# Patient Record
Sex: Male | Born: 1985 | Race: White | Hispanic: No | Marital: Married | Smoking: Never smoker
Health system: Southern US, Community
[De-identification: ages and names within clinical notes are randomized; demographics above are authoritative.]

---

## 2011-08-30 ENCOUNTER — Encounter: Payer: Self-pay | Admitting: Emergency Medicine

## 2011-08-30 ENCOUNTER — Emergency Department (HOSPITAL_COMMUNITY)
Admission: EM | Admit: 2011-08-30 | Discharge: 2011-08-30 | Disposition: A | Discharge status: Home / Self Care | Payer: 59 | Attending: Emergency Medicine | Admitting: Emergency Medicine

## 2011-08-30 DIAGNOSIS — R51 Headache: Secondary | ICD-10-CM | POA: Insufficient documentation

## 2011-08-30 DIAGNOSIS — R55 Syncope and collapse: Secondary | ICD-10-CM | POA: Insufficient documentation

## 2011-08-30 DIAGNOSIS — IMO0001 Reserved for inherently not codable concepts without codable children: Secondary | ICD-10-CM | POA: Insufficient documentation

## 2011-08-30 DIAGNOSIS — R1110 Vomiting, unspecified: Secondary | ICD-10-CM

## 2011-08-30 DIAGNOSIS — R112 Nausea with vomiting, unspecified: Secondary | ICD-10-CM | POA: Insufficient documentation

## 2011-08-30 DIAGNOSIS — R000 Tachycardia, unspecified: Secondary | ICD-10-CM | POA: Insufficient documentation

## 2011-08-30 LAB — BASIC METABOLIC PANEL
BUN: 16 mg/dL (ref 6–23)
CO2: 26 mEq/L (ref 19–32)
Chloride: 102 mEq/L (ref 96–112)
Glucose, Bld: 118 mg/dL — ABNORMAL HIGH (ref 70–99)
Potassium: 3.7 mEq/L (ref 3.5–5.1)
Sodium: 139 mEq/L (ref 135–145)

## 2011-08-30 MED ORDER — ONDANSETRON HCL 4 MG PO TABS: 4.0000 mg | ORAL_TABLET | Freq: Four times a day (QID) | ORAL | Status: AC

## 2011-08-30 MED ORDER — SODIUM CHLORIDE 0.9 % IV BOLUS (SEPSIS)
1000.0000 mL | Freq: Once | INTRAVENOUS | Status: AC
  Administered 2011-08-30: 1000 mL via INTRAVENOUS

## 2011-08-30 MED ORDER — ONDANSETRON HCL 4 MG/2ML IJ SOLN
4.0000 mg | Freq: Once | INTRAMUSCULAR | Status: AC
  Administered 2011-08-30: 4 mg via INTRAVENOUS
  Filled 2011-08-30: qty 2

## 2011-08-30 MED ORDER — KETOROLAC TROMETHAMINE 30 MG/ML IJ SOLN
30.0000 mg | Freq: Once | INTRAMUSCULAR | Status: AC
  Administered 2011-08-30: 30 mg via INTRAVENOUS
  Filled 2011-08-30: qty 1

## 2011-08-30 NOTE — ED Notes (Signed)
Patient denies pain and is resting comfortably.  

## 2011-08-30 NOTE — ED Notes (Signed)
Vital signs stable. 

## 2011-08-30 NOTE — ED Notes (Signed)
Pt states he has had vomiting since Sunday evening  Pt states the vomiting got so bad tonight that he passed out in his bathroom and woke up an hour later  Pt states he feels dehydrated  Pt states also has a sore throat and body aches all over

## 2011-08-30 NOTE — ED Provider Notes (Signed)
History     CSN: [id number] Arrival date & time: 08/30/2011  4:32 AM   First MD Initiated Contact with Patient 08/30/11 [phone]      Chief Complaint  Patient presents with  . Emesis  . Generalized Body Aches    (Consider location/radiation/quality/duration/timing/severity/associated sxs/prior treatment) Patient is a 25 y.o. male presenting with vomiting. The history is provided by the patient. The history is limited by the condition of the patient.  Emesis  Associated symptoms include headaches and myalgias. Pertinent negatives include no abdominal pain, no chills, no diarrhea and no fever.   the patient is a 25 year old male, with no significant past medical history, who presents to emergency department for having prolonged emesis for several days.  He states that he has myalgias and headache from his prolonged emesis.  He denies fevers.  He denies blood in his vomit.  He denies diarrhea.  He denies abdominal pain, rash, or recent antibiotic use.  He has not been around anybody with similar symptoms.  This morning.  He had some the episodes of emesis that he says that he passed out on the floor for about an hour. Level V caveat applies for urgent need for intervention due 2 tachycardia.  History reviewed. No pertinent past medical history.  History reviewed. No pertinent past surgical history.  History reviewed. No pertinent family history.  History  Substance Use Topics  . Smoking status: Never Smoker   . Smokeless tobacco: Not on file  . Alcohol Use: Yes     social      Review of Systems  Constitutional: Negative for fever and chills.  Eyes: Negative for redness.  Respiratory: Negative for shortness of breath.   Cardiovascular: Negative for chest pain.  Gastrointestinal: Positive for nausea and vomiting. Negative for abdominal pain and diarrhea.  Musculoskeletal: Positive for myalgias. Negative for back pain.  Skin: Negative for rash.  Neurological: Positive for syncope  and headaches.  Psychiatric/Behavioral: Negative for confusion.  All other systems reviewed and are negative.    Allergies  Review of patient's allergies indicates no known allergies.  Home Medications  No current outpatient prescriptions on file.  BP 121/92  Pulse 125  Temp(Src) 98.5 F (36.9 C) (Oral)  Resp 20  SpO2 95%  Physical Exam  Vitals reviewed. Constitutional: He is oriented to person, place, and time. He appears well-developed and well-nourished. No distress.       Ill-appearing  HENT:  Head: Normocephalic and atraumatic.  Eyes: EOM are normal. Pupils are equal, round, and reactive to light.  Neck: Normal range of motion. Neck supple.  Cardiovascular: Regular rhythm and normal heart sounds.   No murmur heard.      Tachycardia  Pulmonary/Chest: Effort normal and breath sounds normal. No respiratory distress. He has no wheezes. He has no rales.  Abdominal: Soft. Bowel sounds are normal. He exhibits no distension and no mass. There is no tenderness. There is no rebound and no guarding.  Musculoskeletal: Normal range of motion. He exhibits no edema and no tenderness.  Neurological: He is alert and oriented to person, place, and time. No cranial nerve deficit.  Skin: Skin is warm and dry. He is not diaphoretic.  Psychiatric: He has a normal mood and affect. His behavior is normal.    ED Course  Procedures (including critical care time) 25 year old male, with no significant past medical history presents with prolonged nausea and vomiting for several days resulting in a syncopal episode.  He has a resting tachycardia, and  appears mildly ill.  We will establish an IV fluid bolus and antiemetics, and check a beam that for evaluation.   Labs Reviewed  BASIC METABOLIC PANEL    sxs improved but not resolved.  Will repeat zofran and IVF and add toradol for pain  10:11 AM sxs resolved. Hr = 97 bpm    MDM  Emesis Tachycardia No signs of toxicity.  No hypo-tension.   Tachycardia, resolved in the emergency department.  No severe dehydration        Nicholes Stairs, MD 08/30/11 1012

## 2011-08-30 NOTE — ED Notes (Signed)
MD at bedside. 

## 2011-08-30 NOTE — ED Notes (Signed)
Family at bedside. 

## 2011-08-30 NOTE — ED Notes (Signed)
Pt states he feels 100% better..dermatitis/c to home with antiemetic RX

## 2011-08-30 NOTE — ED Notes (Signed)
Patient is resting comfortably. 

## 2015-09-10 ENCOUNTER — Emergency Department (HOSPITAL_BASED_OUTPATIENT_CLINIC_OR_DEPARTMENT_OTHER)
Admission: EM | Admit: 2015-09-10 | Discharge: 2015-09-10 | Disposition: A | Discharge status: Home / Self Care | Payer: 59 | Attending: Emergency Medicine | Admitting: Emergency Medicine

## 2015-09-10 ENCOUNTER — Emergency Department (HOSPITAL_BASED_OUTPATIENT_CLINIC_OR_DEPARTMENT_OTHER): Payer: 59

## 2015-09-10 ENCOUNTER — Encounter (HOSPITAL_BASED_OUTPATIENT_CLINIC_OR_DEPARTMENT_OTHER): Payer: Self-pay | Admitting: *Deleted

## 2015-09-10 DIAGNOSIS — R0602 Shortness of breath: Secondary | ICD-10-CM | POA: Diagnosis not present

## 2015-09-10 DIAGNOSIS — R112 Nausea with vomiting, unspecified: Secondary | ICD-10-CM | POA: Insufficient documentation

## 2015-09-10 DIAGNOSIS — R1013 Epigastric pain: Secondary | ICD-10-CM | POA: Insufficient documentation

## 2015-09-10 DIAGNOSIS — R079 Chest pain, unspecified: Secondary | ICD-10-CM | POA: Diagnosis present

## 2015-09-10 LAB — HEPATIC FUNCTION PANEL
ALBUMIN: 4.2 g/dL (ref 3.5–5.0)
ALK PHOS: 69 U/L (ref 38–126)
ALT: 40 U/L (ref 17–63)
AST: 28 U/L (ref 15–41)
BILIRUBIN TOTAL: 0.4 mg/dL (ref 0.3–1.2)
Bilirubin, Direct: <0.1 mg/dL — ABNORMAL LOW (ref 0.1–0.5)
TOTAL PROTEIN: 7.5 g/dL (ref 6.5–8.1)

## 2015-09-10 LAB — BASIC METABOLIC PANEL
ANION GAP: 7 (ref 5–15)
BUN: 10 mg/dL (ref 6–20)
CHLORIDE: 109 mmol/L (ref 101–111)
CO2: 24 mmol/L (ref 22–32)
Calcium: 9.2 mg/dL (ref 8.9–10.3)
Creatinine, Ser: 0.9 mg/dL (ref 0.61–1.24)
GFR calc non Af Amer: >60 mL/min (ref >60)
Glucose, Bld: 127 mg/dL — ABNORMAL HIGH (ref 65–99)
POTASSIUM: 3.6 mmol/L (ref 3.5–5.1)
Sodium: 140 mmol/L (ref 135–145)

## 2015-09-10 LAB — CBC
HEMATOCRIT: 44.8 % (ref 39.0–52.0)
HEMOGLOBIN: 15.8 g/dL (ref 13.0–17.0)
MCH: 30.4 pg (ref 26.0–34.0)
MCHC: 35.3 g/dL (ref 30.0–36.0)
MCV: 86.3 fL (ref 78.0–100.0)
Platelets: 182 10*3/uL (ref 150–400)
RBC: 5.19 MIL/uL (ref 4.22–5.81)
RDW: 12 % (ref 11.5–15.5)
WBC: 8.1 10*3/uL (ref 4.0–10.5)

## 2015-09-10 LAB — TROPONIN I: Troponin I: <0.03 ng/mL (ref <0.031)

## 2015-09-10 LAB — LIPASE, BLOOD: Lipase: 22 U/L (ref 11–51)

## 2015-09-10 MED ORDER — SUCRALFATE 1 G PO TABS
1.0000 g | ORAL_TABLET | Freq: Once | ORAL | Status: AC
  Administered 2015-09-10: 1 g via ORAL
  Filled 2015-09-10: qty 1

## 2015-09-10 MED ORDER — ONDANSETRON 8 MG PO TBDP: 8.0000 mg | ORAL_TABLET | Freq: Three times a day (TID) | ORAL | Status: AC | PRN

## 2015-09-10 MED ORDER — ONDANSETRON 8 MG PO TBDP
8.0000 mg | ORAL_TABLET | Freq: Once | ORAL | Status: AC
Start: 2015-09-10 — End: 2015-09-10
  Administered 2015-09-10: 8 mg via ORAL
  Filled 2015-09-10: qty 1

## 2015-09-10 MED ORDER — FAMOTIDINE 20 MG PO TABS
20.0000 mg | ORAL_TABLET | Freq: Once | ORAL | Status: AC
  Administered 2015-09-10: 20 mg via ORAL
  Filled 2015-09-10: qty 1

## 2015-09-10 MED ORDER — SUCRALFATE 1 G PO TABS: 1.0000 g | ORAL_TABLET | Freq: Three times a day (TID) | ORAL | Status: AC

## 2015-09-10 MED ORDER — GI COCKTAIL ~~LOC~~
30.0000 mL | Freq: Once | ORAL | Status: AC
  Administered 2015-09-10: 30 mL via ORAL
  Filled 2015-09-10: qty 30

## 2015-09-10 MED ORDER — FAMOTIDINE 20 MG PO TABS: 20.0000 mg | ORAL_TABLET | Freq: Two times a day (BID) | ORAL | Status: AC

## 2015-09-10 NOTE — ED Provider Notes (Signed)
CSN: [id number]     Arrival date & time 09/10/15  1737 History   First MD Initiated Contact with Patient 09/10/15 1814     Chief Complaint  Patient presents with  . Chest Pain     (Consider location/radiation/quality/duration/timing/severity/associated sxs/prior Treatment) HPI Kenneth Love is a 29 y.o. male with no medical problems presents to emergency department complaining of epigastric pain, chest pain, nausea, shortness of breath. Patient states he woke up this morning with symptoms. He states symptoms have gradually gotten worse to the day. He reports one episode of emesis. He states he did eat breakfast and he did not make his symptoms worse or better. He has been taking Tums today and took Nexium which she states did not help. He denies any pain radiation other than from epigastric area into the chest. Denies back pain. No fever or chills. No diarrhea. No blood in his emesis or stool. No other complaints. Patient is a nonsmoker, only drinks on occasion, but states he did have 2 drinks yesterday. He takes ibuprofen on occasion as well. Patient drinks lots of caffeinated beverages.  History reviewed. No pertinent past medical history. History reviewed. No pertinent past surgical history. No family history on file. Social History  Substance Use Topics  . Smoking status: Never Smoker   . Smokeless tobacco: None  . Alcohol Use: Yes     Comment: social    Review of Systems  Constitutional: Negative for fever and chills.  Respiratory: Negative for cough, chest tightness and shortness of breath.   Cardiovascular: Positive for chest pain. Negative for palpitations and leg swelling.  Gastrointestinal: Positive for nausea and abdominal pain. Negative for vomiting, diarrhea and abdominal distention.  Musculoskeletal: Negative for myalgias, arthralgias, neck pain and neck stiffness.  Skin: Negative for rash.  Allergic/Immunologic: Negative for immunocompromised state.  Neurological:  Negative for dizziness, weakness, light-headedness, numbness and headaches.  All other systems reviewed and are negative.     Allergies  Review of patient's allergies indicates no known allergies.  Home Medications   Prior to Admission medications   Not on File   BP 147/101 mmHg  Pulse 77  Temp(Src) 97.9 F (36.6 C) (Oral)  Resp 20  Wt 113.399 kg  SpO2 100% Physical Exam  Constitutional: He appears well-developed and well-nourished. No distress.  HENT:  Head: Normocephalic and atraumatic.  Eyes: Conjunctivae are normal.  Neck: Neck supple.  Cardiovascular: Normal rate, regular rhythm and normal heart sounds.   Pulmonary/Chest: Effort normal. No respiratory distress. He has no wheezes. He has no rales.  Abdominal: Soft. Bowel sounds are normal. He exhibits no distension. There is tenderness. There is no rebound.  Epigastric tenderness  Musculoskeletal: He exhibits no edema.  Neurological: He is alert.  Skin: Skin is warm and dry.  Nursing note and vitals reviewed.   ED Course  Procedures (including critical care time) Labs Review Labs Reviewed  BASIC METABOLIC PANEL - Abnormal; Notable for the following:    Glucose, Bld 127 (*)    All other components within normal limits  HEPATIC FUNCTION PANEL - Abnormal; Notable for the following:    Bilirubin, Direct <0.1 (*)    All other components within normal limits  CBC  TROPONIN I  LIPASE, BLOOD    Imaging Review No results found. I have personally reviewed and evaluated these images and lab results as part of my medical decision-making.   EKG Interpretation   Date/Time:  Thursday September 10 2015 17:45:57 EST Ventricular Rate:  82  PR Interval:  138 QRS Duration: 90 QT Interval:  362 QTC Calculation: 422 R Axis:   31 Text Interpretation:  Normal sinus rhythm with sinus arrhythmia Normal ECG  Confirmed by Lincoln Brighamees, Liz [phone]([id number]) on 09/10/2015 6:11:40 PM      MDM   Final diagnoses:  Epigastric pain   Non-intractable vomiting with nausea, vomiting of unspecified type     patient with epigastric pain radiating into the chest since yesterday. Feels like his abdomen is bloated. No cardiac history. Abdomen is tender in epigastric area. We'll try GI cocktail, labs including troponin, LFTs, lipase ordered.  7:30 PM Patient not feeling any better after GI cocktail. Labs and chest x-ray are all negative. Will try Carafate and Pepcid.  9:06 PM Pain mildly improved, however patient continued to have nausea. I gave him Zofran which he states helped. Patient currently denies any nausea, pain is improved but still there, mainly in epigastric area. Doubt this is cardiac. Most likely GERD patient's PUD. Patient is currently feeling better, will discharge home with Carafate, Pepcid, Zofran. I did speak to him about worsening symptoms and told him to return if his pain is worsening or if she develops fever and persistent vomiting. Patient voiced understanding.  Filed Vitals:   09/10/15 1930 09/10/15 2017 09/10/15 2027 09/10/15 2030  BP: 126/82  137/107 129/107  Pulse: 72 86 69 75  Temp:      TempSrc:      Resp: 17 26 14 15   Weight:      SpO2: 100% 100% 98% 98%     Jaynie Crumbleatyana Rebecca Cairns, PA-C 09/10/15 2157  Tilden FossaElizabeth Rees, MD 09/11/15 [phone]

## 2015-09-10 NOTE — ED Notes (Signed)
Woke with sharp pain and nausea in his epigastric area. Bloated abdomen. Hx gerd.

## 2015-09-10 NOTE — ED Notes (Addendum)
C/o nausea, sitting on edge of bed with emesis bag, states, "last time I felt this bad I had norovirus", denies sick contacts with others with similar sx. "Pain has intensified", but c/c is nausea at this time.

## 2015-09-10 NOTE — Discharge Instructions (Signed)
Zofran for nausea and vomiting as prescribed. pepcid and carafate as prescribed for pain. See diet suggestions below. Follow up with primary care doctor. Return if worsening.   Gastroesophageal Reflux Disease, Adult Normally, food travels down the esophagus and stays in the stomach to be digested. However, when a person has gastroesophageal reflux disease (GERD), food and stomach acid move back up into the esophagus. When this happens, the esophagus becomes sore and inflamed. Over time, GERD can create small holes (ulcers) in the lining of the esophagus.  CAUSES This condition is caused by a problem with the muscle between the esophagus and the stomach (lower esophageal sphincter, or LES). Normally, the LES muscle closes after food passes through the esophagus to the stomach. When the LES is weakened or abnormal, it does not close properly, and that allows food and stomach acid to go back up into the esophagus. The LES can be weakened by certain dietary substances, medicines, and medical conditions, including:  Tobacco use.  Pregnancy.  Having a hiatal hernia.  Heavy alcohol use.  Certain foods and beverages, such as coffee, chocolate, onions, and peppermint. RISK FACTORS This condition is more likely to develop in:  People who have an increased body weight.  People who have connective tissue disorders.  People who use NSAID medicines. SYMPTOMS Symptoms of this condition include:  Heartburn.  Difficult or painful swallowing.  The feeling of having a lump in the throat.  Abitter taste in the mouth.  Bad breath.  Having a large amount of saliva.  Having an upset or bloated stomach.  Belching.  Chest pain.  Shortness of breath or wheezing.  Ongoing (chronic) cough or a night-time cough.  Wearing away of tooth enamel.  Weight loss. Different conditions can cause chest pain. Make sure to see your health care provider if you experience chest pain. DIAGNOSIS Your  health care provider will take a medical history and perform a physical exam. To determine if you have mild or severe GERD, your health care provider may also monitor how you respond to treatment. You may also have other tests, including:  An endoscopy toexamine your stomach and esophagus with a small camera.  A test thatmeasures the acidity level in your esophagus.  A test thatmeasures how much pressure is on your esophagus.  A barium swallow or modified barium swallow to show the shape, size, and functioning of your esophagus. TREATMENT The goal of treatment is to help relieve your symptoms and to prevent complications. Treatment for this condition may vary depending on how severe your symptoms are. Your health care provider may recommend:  Changes to your diet.  Medicine.  Surgery. HOME CARE INSTRUCTIONS Diet  Follow a diet as recommended by your health care provider. This may involve avoiding foods and drinks such as:  Coffee and tea (with or without caffeine).  Drinks that containalcohol.  Energy drinks and sports drinks.  Carbonated drinks or sodas.  Chocolate and cocoa.  Peppermint and mint flavorings.  Garlic and onions.  Horseradish.  Spicy and acidic foods, including peppers, chili powder, curry powder, vinegar, hot sauces, and barbecue sauce.  Citrus fruit juices and citrus fruits, such as oranges, lemons, and limes.  Tomato-based foods, such as red sauce, chili, salsa, and pizza with red sauce.  Fried and fatty foods, such as donuts, french fries, potato chips, and high-fat dressings.  High-fat meats, such as hot dogs and fatty cuts of red and white meats, such as rib eye steak, sausage, ham, and bacon.  High-fat dairy items, such as whole milk, butter, and cream cheese.  Eat small, frequent meals instead of large meals.  Avoid drinking large amounts of liquid with your meals.  Avoid eating meals during the 2-3 hours before bedtime.  Avoid lying  down right after you eat.  Do not exercise right after you eat. General Instructions  Pay attention to any changes in your symptoms.  Take over-the-counter and prescription medicines only as told by your health care provider. Do not take aspirin, ibuprofen, or other NSAIDs unless your health care provider told you to do so.  Do not use any tobacco products, including cigarettes, chewing tobacco, and e-cigarettes. If you need help quitting, ask your health care provider.  Wear loose-fitting clothing. Do not wear anything tight around your waist that causes pressure on your abdomen.  Raise (elevate) the head of your bed 6 inches (15cm).  Try to reduce your stress, such as with yoga or meditation. If you need help reducing stress, ask your health care provider.  If you are overweight, reduce your weight to an amount that is healthy for you. Ask your health care provider for guidance about a safe weight loss goal.  Keep all follow-up visits as told by your health care provider. This is important. SEEK MEDICAL CARE IF:  You have new symptoms.  You have unexplained weight loss.  You have difficulty swallowing, or it hurts to swallow.  You have wheezing or a persistent cough.  Your symptoms do not improve with treatment.  You have a hoarse voice. SEEK IMMEDIATE MEDICAL CARE IF:  You have pain in your arms, neck, jaw, teeth, or back.  You feel sweaty, dizzy, or light-headed.  You have chest pain or shortness of breath.  You vomit and your vomit looks like blood or coffee grounds.  You faint.  Your stool is bloody or black.  You cannot swallow, drink, or eat.   This information is not intended to replace advice given to you by your health care provider. Make sure you discuss any questions you have with your health care provider.   Document Released: 06/08/2005 Document Revised: 05/20/2015 Document Reviewed: 12/24/2014 Elsevier Interactive Patient Education 2016 [employer]  Food Choices for Gastroesophageal Reflux Disease, Adult When you have gastroesophageal reflux disease (GERD), the foods you eat and your eating habits are very important. Choosing the right foods can help ease the discomfort of GERD. WHAT GENERAL GUIDELINES DO I NEED TO FOLLOW?  Choose fruits, vegetables, whole grains, low-fat dairy products, and low-fat meat, fish, and poultry.  Limit fats such as oils, salad dressings, butter, nuts, and avocado.  Keep a food diary to identify foods that cause symptoms.  Avoid foods that cause reflux. These may be different for different people.  Eat frequent small meals instead of three large meals each day.  Eat your meals slowly, in a relaxed setting.  Limit fried foods.  Cook foods using methods other than frying.  Avoid drinking alcohol.  Avoid drinking large amounts of liquids with your meals.  Avoid bending over or lying down until 2-3 hours after eating. WHAT FOODS ARE NOT RECOMMENDED? The following are some foods and drinks that may worsen your symptoms: Vegetables Tomatoes. Tomato juice. Tomato and spaghetti sauce. Chili peppers. Onion and garlic. Horseradish. Fruits Oranges, grapefruit, and lemon (fruit and juice). Meats High-fat meats, fish, and poultry. This includes hot dogs, ribs, ham, sausage, salami, and bacon. Dairy Whole milk and chocolate milk. Sour cream. Cream. Butter. Ice cream.  Cream cheese.  Beverages Coffee and tea, with or without caffeine. Carbonated beverages or energy drinks. Condiments Hot sauce. Barbecue sauce.  Sweets/Desserts Chocolate and cocoa. Donuts. Peppermint and spearmint. Fats and Oils High-fat foods, including [place] fries and potato chips. Other Vinegar. Strong spices, such as black pepper, white pepper, red pepper, cayenne, curry powder, cloves, ginger, and chili powder. The items listed above may not be a complete list of foods and beverages to avoid. Contact your dietitian for more  information.   This information is not intended to replace advice given to you by your health care provider. Make sure you discuss any questions you have with your health care provider.   Document Released: 08/29/2005 Document Revised: 09/19/2014 Document Reviewed: 07/03/2013 Elsevier Interactive Patient Education [employer]

## 2015-09-17 ENCOUNTER — Other Ambulatory Visit: Payer: Self-pay | Admitting: Family Medicine

## 2015-09-17 DIAGNOSIS — R1011 Right upper quadrant pain: Secondary | ICD-10-CM | POA: Diagnosis not present

## 2015-09-17 DIAGNOSIS — R1013 Epigastric pain: Secondary | ICD-10-CM | POA: Diagnosis not present

## 2015-09-18 ENCOUNTER — Ambulatory Visit
Admission: RE | Admit: 2015-09-18 | Discharge: 2015-09-18 | Disposition: A | Discharge status: Home / Self Care | Payer: 59 | Source: Ambulatory Visit | Attending: Family Medicine | Admitting: Family Medicine

## 2015-09-18 DIAGNOSIS — R1011 Right upper quadrant pain: Secondary | ICD-10-CM | POA: Diagnosis not present

## 2017-06-01 IMAGING — DX DG CHEST 2V
2 series · 2 of 2 positions shown · non-contrast
Comparison: None.

CLINICAL DATA: Sharp chest pain radiating to epigastric region with
nausea 12 hours.

EXAM:
CHEST  2 VIEW

[chest pa]
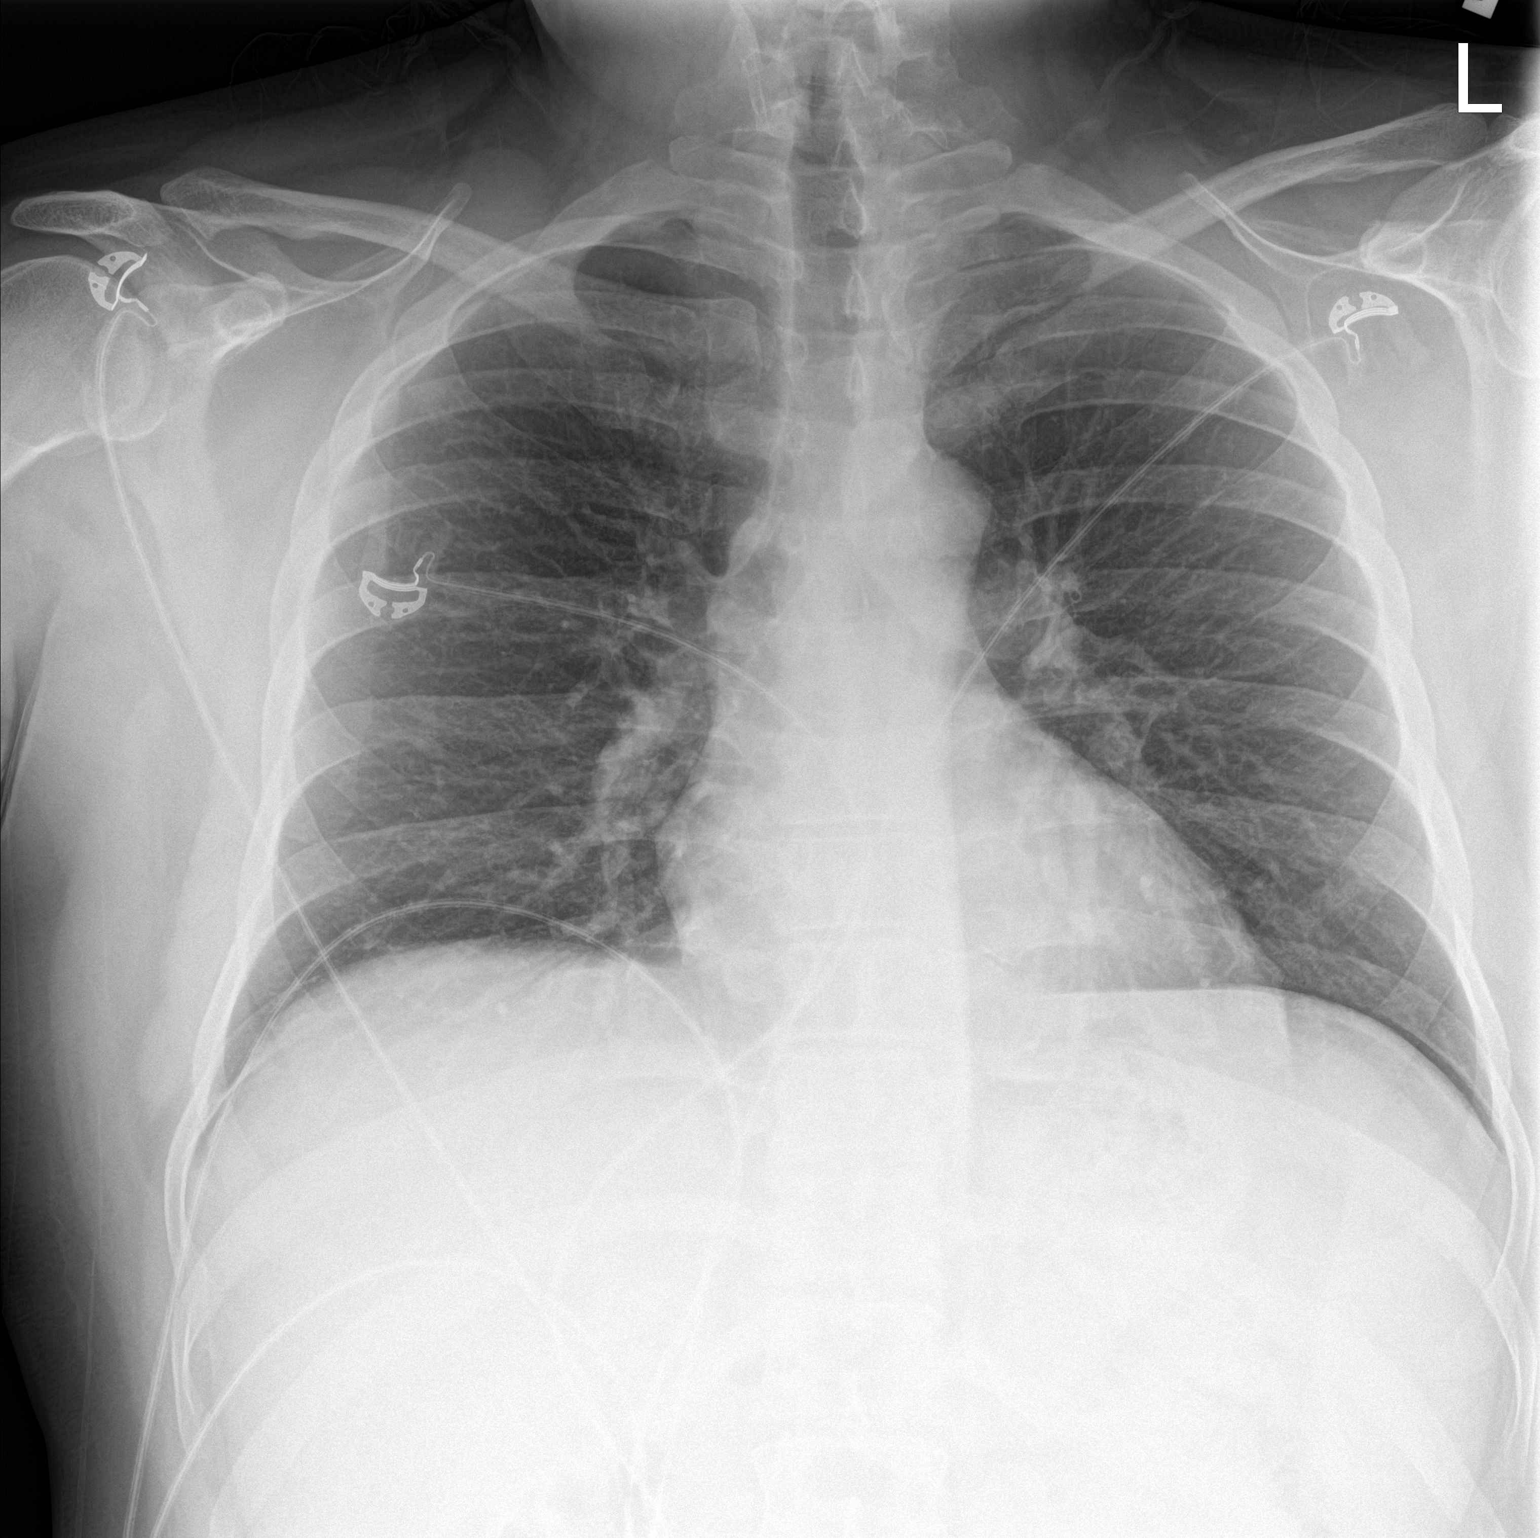

[chest lat]
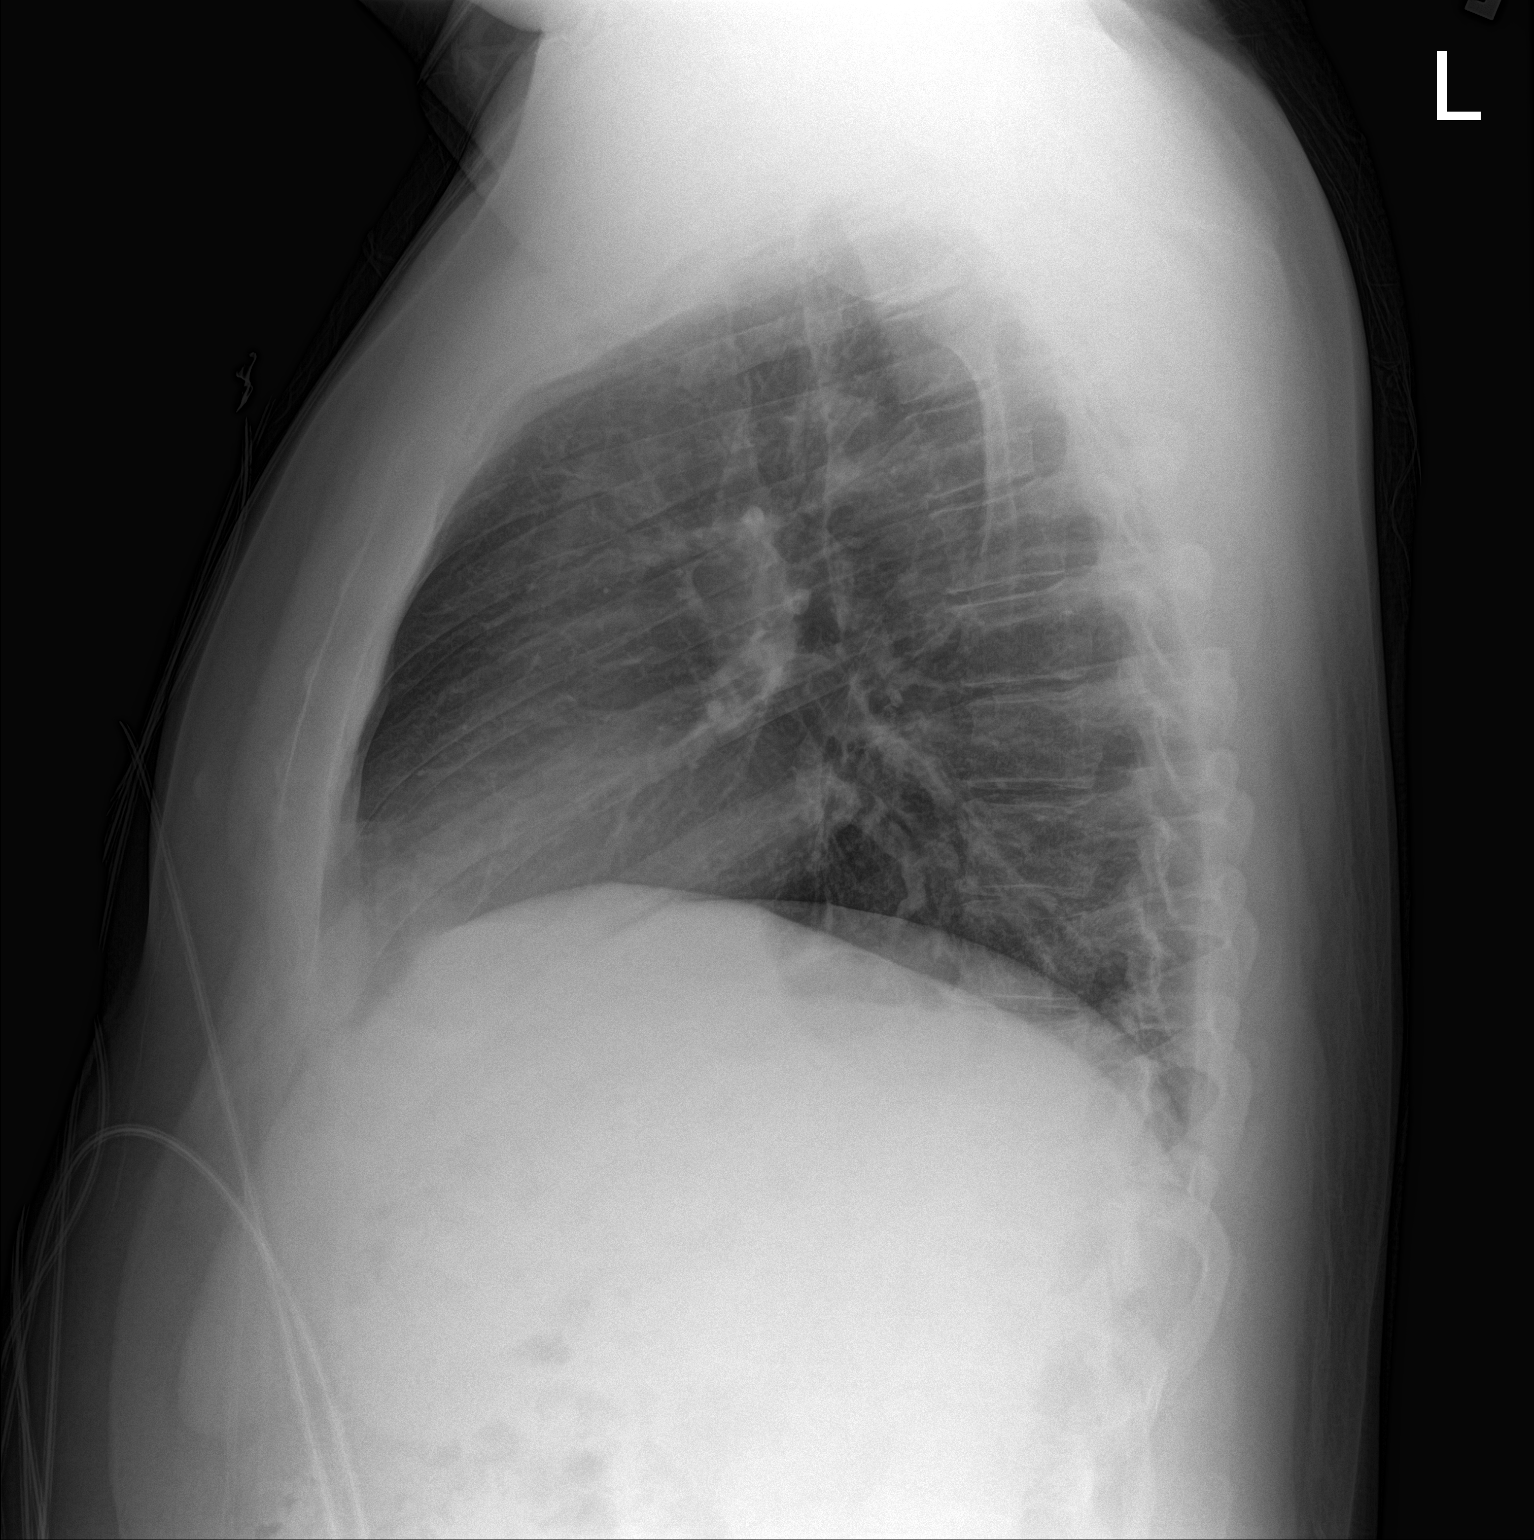

[2 of 2 positions shown; findings below may reference images not displayed]

FINDINGS: The heart size and mediastinal contours are within normal limits.
Both lungs are clear. The visualized skeletal structures are
unremarkable.
IMPRESSION: No active cardiopulmonary disease.

## 2017-06-09 IMAGING — US US ABDOMEN LIMITED
1 series · 14 of 25 positions shown · non-contrast
Comparison: No prior.

CLINICAL DATA: Right upper quadrant pain.

EXAM:
US ABDOMEN LIMITED - RIGHT UPPER QUADRANT

[Series 1: us abdomen limited · 0.24mm/px · 14 of 47 slices shown]
[im 1/47]
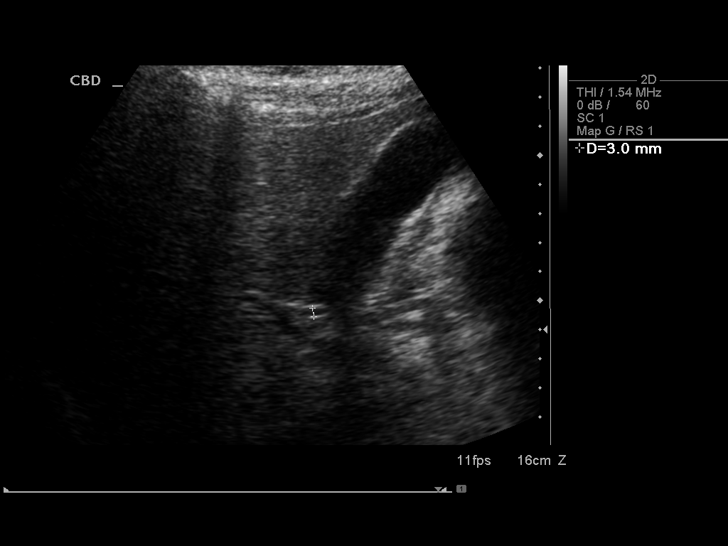
[im 4/47]
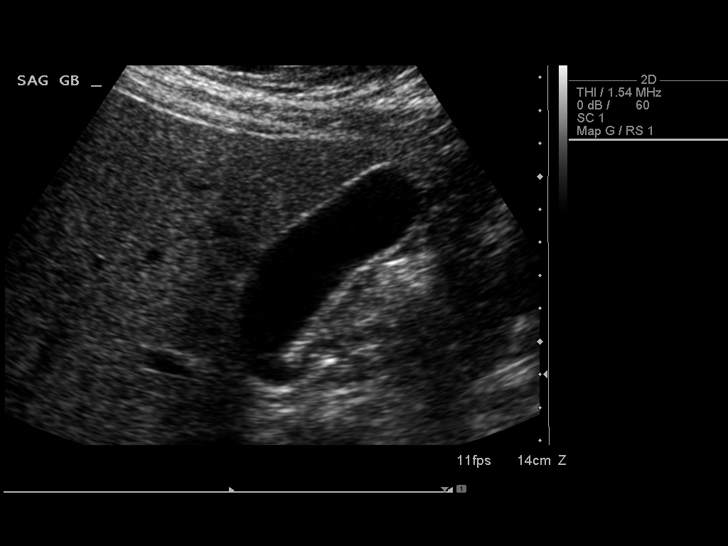
[im 8/47]
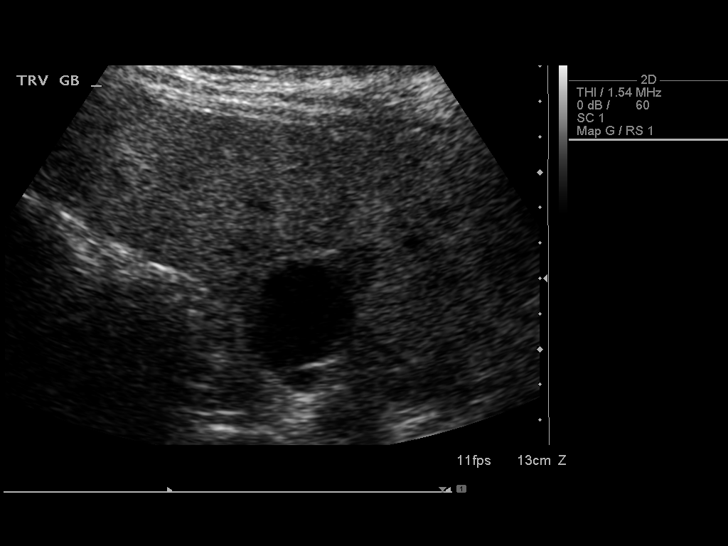
[im 12/47]
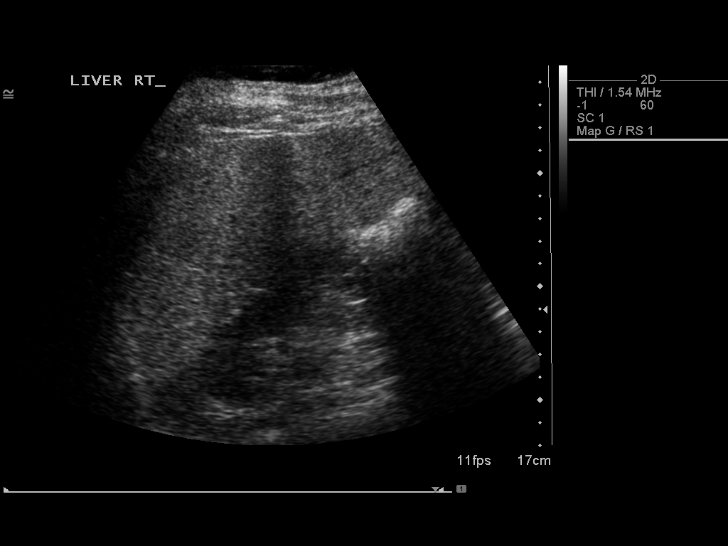
[im 16/47]
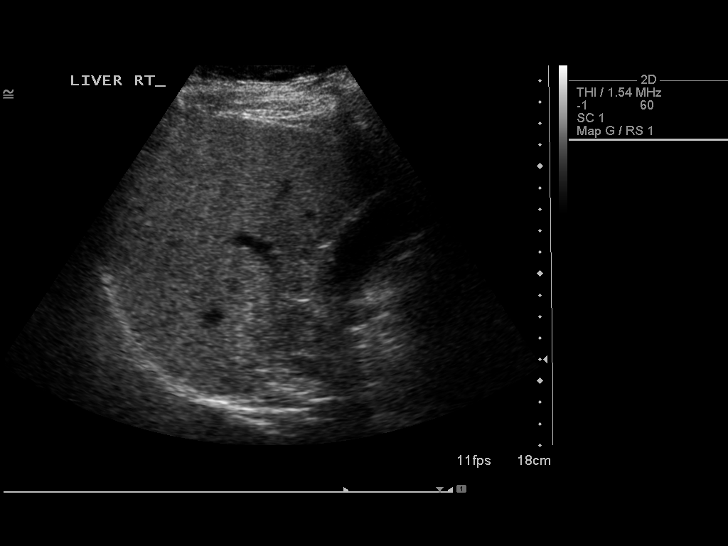
[im 18/47]
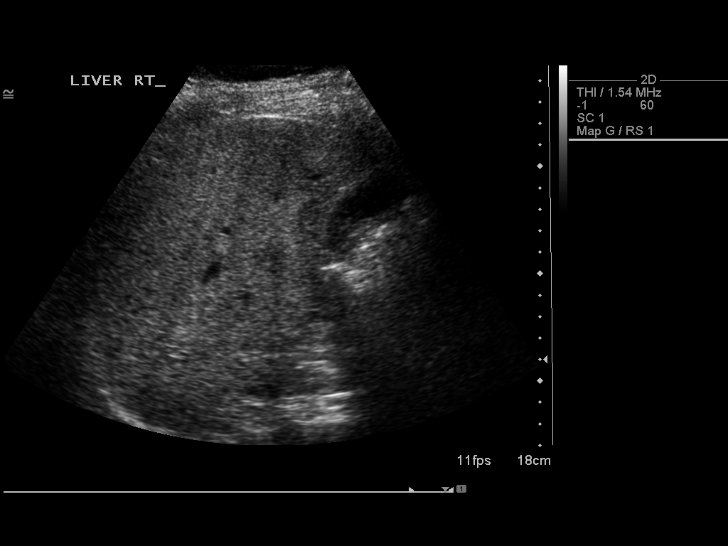
[im 22/47]
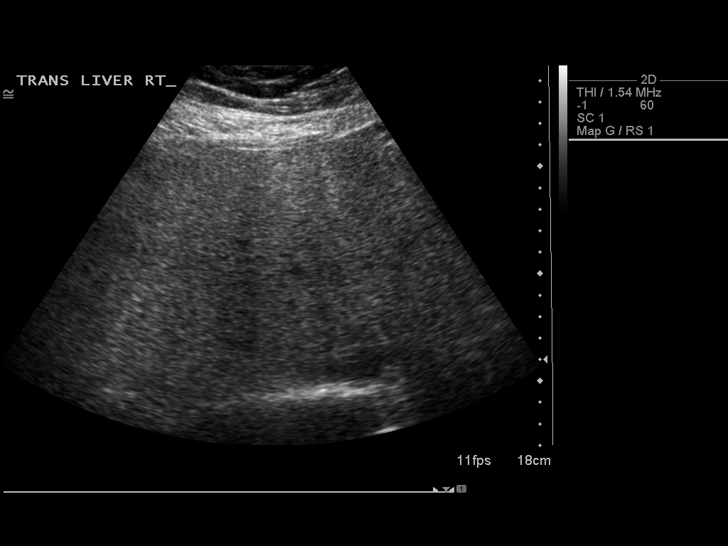
[im 25/47]
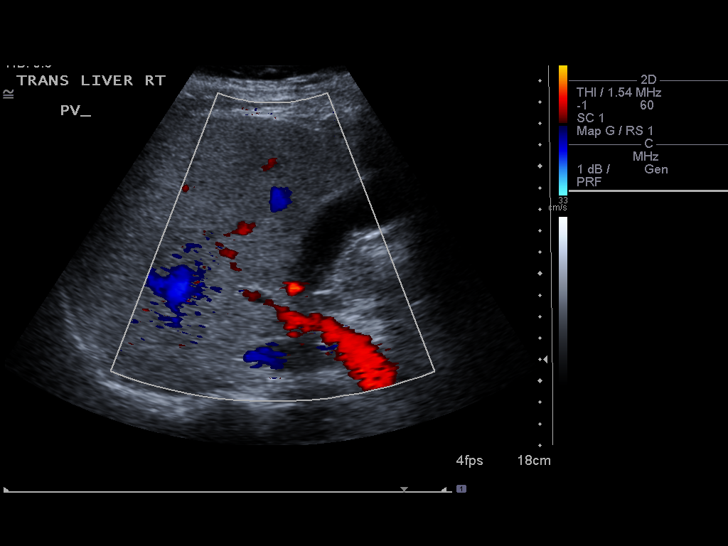
[im 29/47]
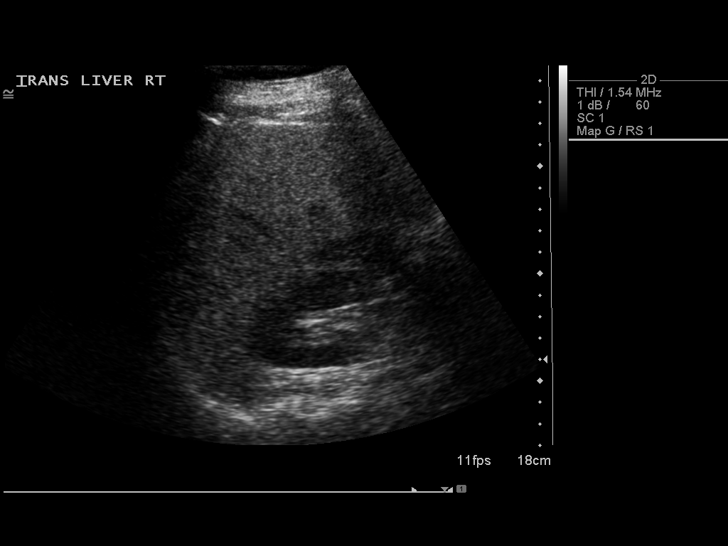
[im 31/47]
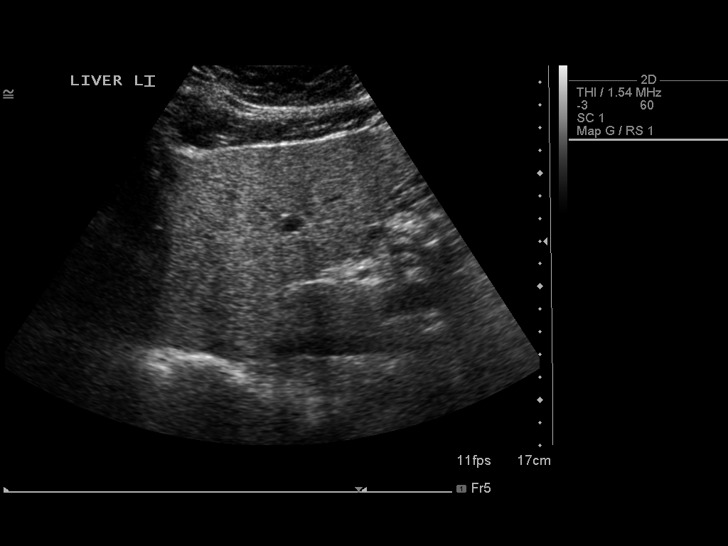
[im 35/47]
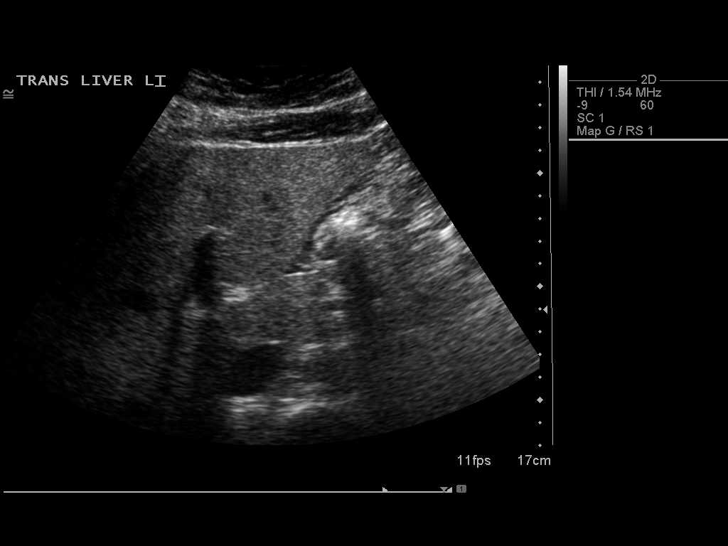
[im 39/47]
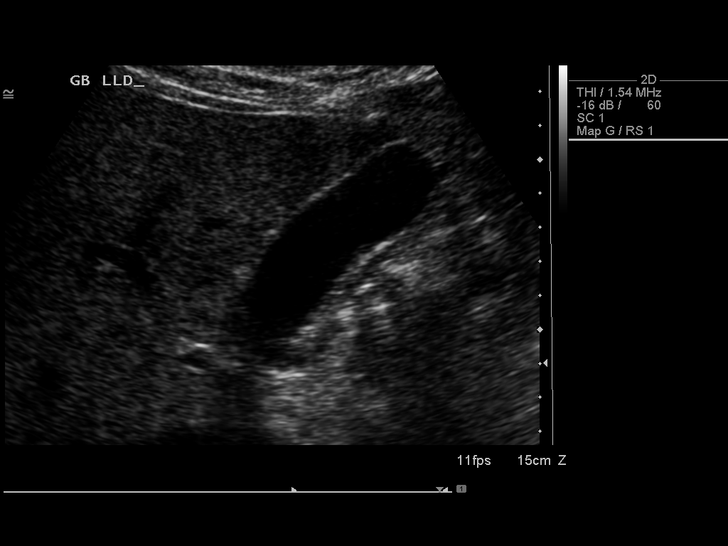
[im 43/47]
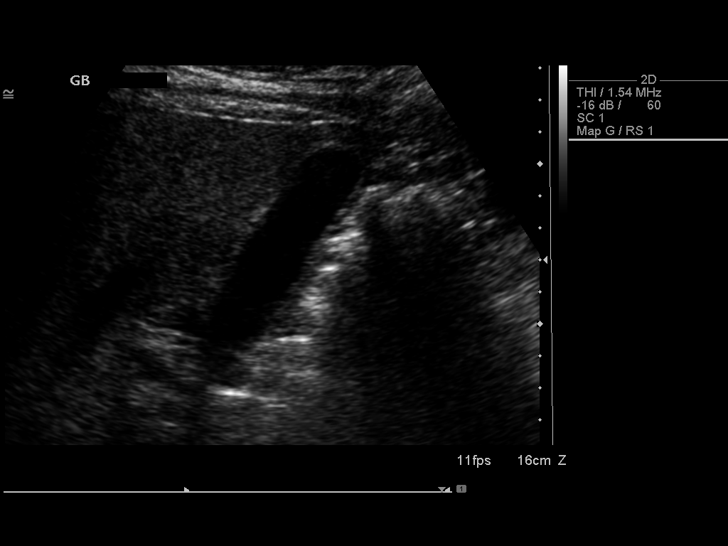
[im 47/47]
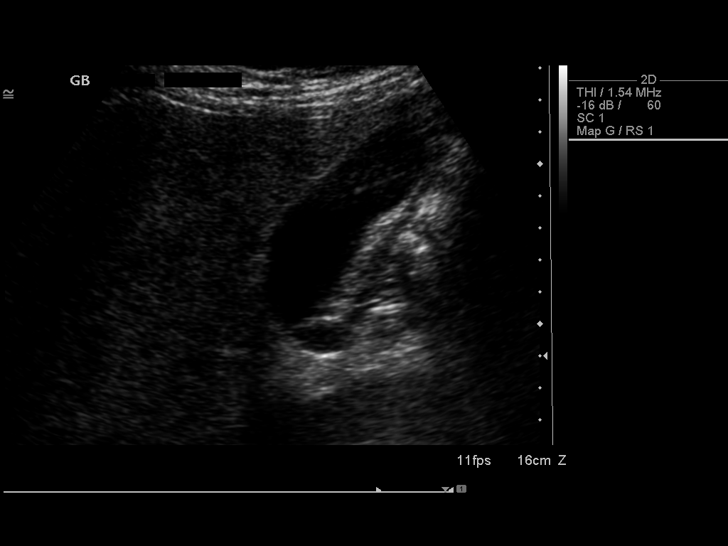

[14 of 25 positions shown; findings below may reference images not displayed]

FINDINGS: Gallbladder:

No gallstones or wall thickening visualized. No sonographic Murphy
sign noted by sonographer.

Common bile duct:

Diameter: 3.0 mm

Liver:

Liver is inhomogeneous consistent fatty infiltration and/or
hepatocellular disease. No focal hepatic abnormality identified.
IMPRESSION: Liver is inhomogeneous consistent with fatty infiltration and/or
hepatocellular disease. Exam is otherwise unremarkable .

## 2020-07-01 DIAGNOSIS — Z0000 Encounter for general adult medical examination without abnormal findings: Secondary | ICD-10-CM | POA: Diagnosis not present

## 2020-07-01 DIAGNOSIS — E782 Mixed hyperlipidemia: Secondary | ICD-10-CM | POA: Diagnosis not present

## 2020-07-01 DIAGNOSIS — Z23 Encounter for immunization: Secondary | ICD-10-CM | POA: Diagnosis not present

## 2020-07-01 DIAGNOSIS — F900 Attention-deficit hyperactivity disorder, predominantly inattentive type: Secondary | ICD-10-CM | POA: Diagnosis not present

## 2020-07-29 DIAGNOSIS — F900 Attention-deficit hyperactivity disorder, predominantly inattentive type: Secondary | ICD-10-CM | POA: Diagnosis not present

## 2021-07-09 DIAGNOSIS — E782 Mixed hyperlipidemia: Secondary | ICD-10-CM | POA: Diagnosis not present

## 2021-07-09 DIAGNOSIS — G43909 Migraine, unspecified, not intractable, without status migrainosus: Secondary | ICD-10-CM | POA: Diagnosis not present

## 2021-07-09 DIAGNOSIS — Z0000 Encounter for general adult medical examination without abnormal findings: Secondary | ICD-10-CM | POA: Diagnosis not present

## 2021-07-23 DIAGNOSIS — J029 Acute pharyngitis, unspecified: Secondary | ICD-10-CM | POA: Diagnosis not present

## 2021-07-23 DIAGNOSIS — R059 Cough, unspecified: Secondary | ICD-10-CM | POA: Diagnosis not present

## 2021-07-23 DIAGNOSIS — Z03818 Encounter for observation for suspected exposure to other biological agents ruled out: Secondary | ICD-10-CM | POA: Diagnosis not present

## 2021-07-23 DIAGNOSIS — R509 Fever, unspecified: Secondary | ICD-10-CM | POA: Diagnosis not present

## 2021-12-14 ENCOUNTER — Other Ambulatory Visit (HOSPITAL_COMMUNITY): Payer: Self-pay

## 2021-12-14 DIAGNOSIS — J029 Acute pharyngitis, unspecified: Secondary | ICD-10-CM | POA: Diagnosis not present

## 2021-12-14 DIAGNOSIS — R059 Cough, unspecified: Secondary | ICD-10-CM | POA: Diagnosis not present

## 2021-12-14 MED ORDER — BENZONATATE 200 MG PO CAPS
ORAL_CAPSULE | ORAL | 0 refills | Status: AC
  Filled 2021-12-14: qty 30, 10d supply, fill #0

## 2021-12-14 MED ORDER — AMOXICILLIN-POT CLAVULANATE 875-125 MG PO TABS
ORAL_TABLET | ORAL | 0 refills | Status: AC
  Filled 2021-12-14: qty 14, 7d supply, fill #0

## 2021-12-14 MED ORDER — PROMETHAZINE-DM 6.25-15 MG/5ML PO SYRP
ORAL_SOLUTION | ORAL | 0 refills | Status: AC
  Filled 2021-12-14: qty 100, 5d supply, fill #0

## 2021-12-28 DIAGNOSIS — J020 Streptococcal pharyngitis: Secondary | ICD-10-CM | POA: Diagnosis not present

## 2021-12-28 DIAGNOSIS — Z6837 Body mass index (BMI) 37.0-37.9, adult: Secondary | ICD-10-CM | POA: Diagnosis not present

## 2022-07-21 ENCOUNTER — Other Ambulatory Visit (HOSPITAL_COMMUNITY): Payer: Self-pay

## 2022-07-21 DIAGNOSIS — Z6838 Body mass index (BMI) 38.0-38.9, adult: Secondary | ICD-10-CM | POA: Diagnosis not present

## 2022-07-21 DIAGNOSIS — G43909 Migraine, unspecified, not intractable, without status migrainosus: Secondary | ICD-10-CM | POA: Diagnosis not present

## 2022-07-21 DIAGNOSIS — E669 Obesity, unspecified: Secondary | ICD-10-CM | POA: Diagnosis not present

## 2022-07-21 DIAGNOSIS — E782 Mixed hyperlipidemia: Secondary | ICD-10-CM | POA: Diagnosis not present

## 2022-07-21 DIAGNOSIS — Z0000 Encounter for general adult medical examination without abnormal findings: Secondary | ICD-10-CM | POA: Diagnosis not present

## 2022-07-21 DIAGNOSIS — R7309 Other abnormal glucose: Secondary | ICD-10-CM | POA: Diagnosis not present

## 2022-07-21 MED ORDER — WEGOVY 0.25 MG/0.5ML ~~LOC~~ SOAJ
SUBCUTANEOUS | 0 refills | Status: AC
  Filled 2022-07-21 – 2022-09-26 (×2): qty 2, 28d supply, fill #0

## 2022-07-21 MED ORDER — SUMATRIPTAN SUCCINATE 50 MG PO TABS
ORAL_TABLET | ORAL | 5 refills | Status: AC
  Filled 2022-07-21: qty 12, 30d supply, fill #0
  Filled 2022-09-28: qty 12, 30d supply, fill #1

## 2022-07-25 ENCOUNTER — Other Ambulatory Visit (HOSPITAL_COMMUNITY): Payer: Self-pay

## 2022-08-08 ENCOUNTER — Other Ambulatory Visit (HOSPITAL_COMMUNITY): Payer: Self-pay

## 2022-08-15 ENCOUNTER — Other Ambulatory Visit (HOSPITAL_COMMUNITY): Payer: Self-pay

## 2022-08-15 DIAGNOSIS — U071 COVID-19: Secondary | ICD-10-CM | POA: Diagnosis not present

## 2022-08-15 MED ORDER — PAXLOVID (300/100) 20 X 150 MG & 10 X 100MG PO TBPK
ORAL_TABLET | ORAL | 0 refills | Status: AC
  Filled 2022-08-15: qty 30, 5d supply, fill #0

## 2022-08-22 ENCOUNTER — Other Ambulatory Visit (HOSPITAL_COMMUNITY): Payer: Self-pay

## 2022-09-09 ENCOUNTER — Other Ambulatory Visit (HOSPITAL_COMMUNITY): Payer: Self-pay

## 2022-09-26 ENCOUNTER — Other Ambulatory Visit (HOSPITAL_COMMUNITY): Payer: Self-pay

## 2022-09-28 ENCOUNTER — Other Ambulatory Visit: Payer: Self-pay

## 2022-10-04 ENCOUNTER — Other Ambulatory Visit (HOSPITAL_COMMUNITY): Payer: Self-pay

## 2022-10-05 ENCOUNTER — Other Ambulatory Visit (HOSPITAL_COMMUNITY): Payer: Self-pay

## 2022-10-05 MED ORDER — WEGOVY 0.5 MG/0.5ML ~~LOC~~ SOAJ
0.5000 mg | SUBCUTANEOUS | 0 refills | Status: AC
  Filled 2022-10-05: qty 2, 28d supply, fill #0

## 2022-11-10 ENCOUNTER — Other Ambulatory Visit (HOSPITAL_COMMUNITY): Payer: Self-pay

## 2022-12-06 ENCOUNTER — Other Ambulatory Visit (HOSPITAL_COMMUNITY): Payer: Self-pay

## 2022-12-06 MED ORDER — WEGOVY 1 MG/0.5ML ~~LOC~~ SOAJ
1.0000 mg | SUBCUTANEOUS | 0 refills | Status: AC
  Filled 2022-12-06: qty 2, 28d supply, fill #0

## 2022-12-07 ENCOUNTER — Other Ambulatory Visit (HOSPITAL_COMMUNITY): Payer: Self-pay

## 2023-02-08 ENCOUNTER — Other Ambulatory Visit (HOSPITAL_COMMUNITY): Payer: Self-pay

## 2023-02-08 DIAGNOSIS — H5789 Other specified disorders of eye and adnexa: Secondary | ICD-10-CM | POA: Diagnosis not present

## 2023-02-08 MED ORDER — TOBRAMYCIN 0.3 % OP SOLN
1.0000 [drp] | OPHTHALMIC | 0 refills | Status: AC
  Filled 2023-02-08: qty 5, 7d supply, fill #0

## 2023-02-08 MED ORDER — PREDNISONE 10 MG (21) PO TBPK
ORAL_TABLET | ORAL | 0 refills | Status: AC
  Filled 2023-02-08: qty 21, 6d supply, fill #0

## 2024-03-27 DIAGNOSIS — G43909 Migraine, unspecified, not intractable, without status migrainosus: Secondary | ICD-10-CM | POA: Diagnosis not present

## 2024-03-27 DIAGNOSIS — E782 Mixed hyperlipidemia: Secondary | ICD-10-CM | POA: Diagnosis not present

## 2024-03-27 DIAGNOSIS — Z0000 Encounter for general adult medical examination without abnormal findings: Secondary | ICD-10-CM | POA: Diagnosis not present

## 2024-03-27 DIAGNOSIS — E669 Obesity, unspecified: Secondary | ICD-10-CM | POA: Diagnosis not present

## 2024-03-29 ENCOUNTER — Other Ambulatory Visit (HOSPITAL_COMMUNITY): Payer: Self-pay

## 2024-03-29 MED ORDER — SUMATRIPTAN SUCCINATE 50 MG PO TABS
50.0000 mg | ORAL_TABLET | Freq: Two times a day (BID) | ORAL | 5 refills | Status: AC
  Filled 2024-03-29: qty 12, 20d supply, fill #0

## 2024-07-09 ENCOUNTER — Other Ambulatory Visit (HOSPITAL_COMMUNITY): Payer: Self-pay

## 2024-07-09 MED ORDER — FLUZONE 0.5 ML IM SUSY
0.5000 mL | PREFILLED_SYRINGE | Freq: Once | INTRAMUSCULAR | 0 refills | Status: AC
  Filled 2024-07-09: qty 0.5, 1d supply, fill #0

## 2024-08-29 ENCOUNTER — Other Ambulatory Visit (HOSPITAL_COMMUNITY): Payer: Self-pay

## 2024-08-29 MED ORDER — SUMATRIPTAN SUCCINATE 50 MG PO TABS
ORAL_TABLET | ORAL | 5 refills | Status: AC
  Filled 2024-08-29: qty 12, 30d supply, fill #0

## 2024-08-30 ENCOUNTER — Other Ambulatory Visit (HOSPITAL_COMMUNITY): Payer: Self-pay
